# Patient Record
Sex: Male | Born: 1995 | Race: White | Hispanic: No | Marital: Married | Smoking: Never smoker
Health system: Southern US, Community
[De-identification: ages and names within clinical notes are randomized; demographics above are authoritative.]

## PROBLEM LIST (undated history)

## (undated) DIAGNOSIS — K7581 Nonalcoholic steatohepatitis (NASH): Secondary | ICD-10-CM

## (undated) DIAGNOSIS — E039 Hypothyroidism, unspecified: Secondary | ICD-10-CM

## (undated) DIAGNOSIS — L259 Unspecified contact dermatitis, unspecified cause: Secondary | ICD-10-CM

## (undated) DIAGNOSIS — E559 Vitamin D deficiency, unspecified: Secondary | ICD-10-CM

## (undated) DIAGNOSIS — R748 Abnormal levels of other serum enzymes: Secondary | ICD-10-CM

## (undated) HISTORY — DX: Unspecified contact dermatitis, unspecified cause: L25.9

## (undated) HISTORY — DX: Hypothyroidism, unspecified: E03.9

## (undated) HISTORY — PX: GANGLION CYST EXCISION: SHX1691

## (undated) HISTORY — PX: CIRCUMCISION: SUR203

## (undated) HISTORY — DX: Vitamin D deficiency, unspecified: E55.9

## (undated) HISTORY — DX: Abnormal levels of other serum enzymes: R74.8

## (undated) HISTORY — DX: Nonalcoholic steatohepatitis (NASH): K75.81

---
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
## 2013-03-30 ENCOUNTER — Ambulatory Visit: Payer: Self-pay | Admitting: Physician Assistant

## 2014-10-24 ENCOUNTER — Ambulatory Visit: Payer: Self-pay | Admitting: Physician Assistant

## 2015-01-01 LAB — BASIC METABOLIC PANEL
BUN: 12 mg/dL (ref 4–21)
Creatinine: 1.2 mg/dL (ref <=1.3)
GLUCOSE: 90 mg/dL
Potassium: 4.4 mmol/L (ref 3.4–5.3)
Sodium: 141 mmol/L (ref 137–147)

## 2015-01-01 LAB — CBC AND DIFFERENTIAL
HEMATOCRIT: 44 % (ref 41–53)
HEMOGLOBIN: 15.2 g/dL (ref 13.5–17.5)
Neutrophils Absolute: 6 /uL
PLATELETS: 301 10*3/uL (ref 150–399)
WBC: 8.9 10*3/mL

## 2015-01-01 LAB — TSH: TSH: 2.8 u[IU]/mL (ref <=5.90)

## 2015-01-08 LAB — HEPATIC FUNCTION PANEL
ALK PHOS: 94 U/L (ref 25–125)
ALT: 100 U/L — AB (ref 10–40)
AST: 42 U/L — AB (ref 14–40)
BILIRUBIN, TOTAL: 0.5 mg/dL
Bilirubin, Direct: 0.14 mg/dL (ref 0.01–0.4)

## 2015-01-15 ENCOUNTER — Ambulatory Visit: Payer: Self-pay | Admitting: Family Medicine

## 2015-04-29 ENCOUNTER — Ambulatory Visit (INDEPENDENT_AMBULATORY_CARE_PROVIDER_SITE_OTHER): Payer: 59 | Admitting: Family Medicine

## 2015-04-29 ENCOUNTER — Encounter: Payer: Self-pay | Admitting: Family Medicine

## 2015-04-29 VITALS — BP 118/68 | HR 82 | Temp 98.2°F | Resp 16 | Ht 72.0 in | Wt 194.3 lb

## 2015-04-29 DIAGNOSIS — E038 Other specified hypothyroidism: Secondary | ICD-10-CM

## 2015-04-29 DIAGNOSIS — E559 Vitamin D deficiency, unspecified: Secondary | ICD-10-CM

## 2015-04-29 DIAGNOSIS — K7581 Nonalcoholic steatohepatitis (NASH): Secondary | ICD-10-CM | POA: Diagnosis not present

## 2015-04-29 DIAGNOSIS — L237 Allergic contact dermatitis due to plants, except food: Secondary | ICD-10-CM

## 2015-04-29 DIAGNOSIS — E063 Autoimmune thyroiditis: Secondary | ICD-10-CM

## 2015-04-29 MED ORDER — PREDNISONE 5 MG (48) PO TBPK: 5.0000 mg | ORAL_TABLET | Freq: Every day | ORAL | Status: DC

## 2015-04-29 NOTE — Progress Notes (Signed)
Name: Carlos Allen   MRN: [id number]    DOB: 19-Jul-1996   Date:04/29/2015       Progress Note  Subjective  Chief Complaint  Chief Complaint  Patient presents with  . Poison Ivy    Patient states that area is extremely irritated. He states that he got in to it on Sunday and developed rash on right arm yesterday.     HPI  NASH: he had elevated liver enzymes twice in 2016, US showed fatty liver, we will check other labs, he has not changed his diet, but has been exercise twice a week.  He denies nausea, vomiting, abdominal pain or steatorrhea.  Hypothyroidism: he has not seen Dr. Dario Guardian in over 9 months ago, he was taking Synthroid daily but stopped a few months ago because he was forgetting to take it and felt better without the medication, no weight gain since he stopped medication. Denies constipation, or dysphagia.   Rash: he was in the woods 5 days ago and four days ago the rash showed up and is getting much worse. Very pruriginous, but only mild spreading. Taking Benadryl and using topical calamine lotion.   Patient Active Problem List   Diagnosis Date Noted  . NASH (nonalcoholic steatohepatitis) 04/29/2015  . Vitamin D deficiency 04/29/2015  . Hypothyroidism, acquired, autoimmune 01/12/2010    Past Surgical History  Procedure Laterality Date  . Ganglion cyst excision    . Circumcision      Family History  Problem Relation Age of Onset  . Hypothyroidism Mother   . Amblyopia Brother     History   Social History  . Marital Status: Single    Spouse Name: N/A  . Number of Children: N/A  . Years of Education: N/A   Occupational History  . Student and [employer]    Social History Main Topics  . Smoking status: Never Smoker   . Smokeless tobacco: Not on file  . Alcohol Use: 0.0 oz/week    0 Standard drinks or equivalent per week     Comment: occasionally  . Drug Use: No  . Sexual Activity: Yes   Other Topics Concern  . Not  on file   Social History Narrative    No current outpatient prescriptions on file.  No Known Allergies   ROS  Constitutional: Negative for fever or weight change.  Respiratory: Negative for cough and shortness of breath.   Cardiovascular: Negative for chest pain or palpitations.  Gastrointestinal: Negative for abdominal pain, no bowel changes.  Musculoskeletal: Negative for gait problem or joint swelling.  Skin: positive  for rash.  Neurological: Negative for dizziness or headache.  No other specific complaints in a complete review of systems (except as listed in HPI above).  Objective  Filed Vitals:   04/29/15 1426  BP: 118/68  Pulse: 82  Temp: 98.2 F (36.8 C)  Resp: 16  Height: 6' (1.829 m)  Weight: 194 lb 4.8 oz (88.134 kg)  SpO2: 97%    Body mass index is 26.35 kg/(m^2).  Physical Exam  Constitutional: Patient appears well-developed and well-nourished. No distress.  Eyes:  No scleral icterus. PERL Neck: Normal range of motion. Neck supple. Negative for thyromegaly Skin: erythematous rash on right inner upper arm, some linear areas , no vesicles Cardiovascular: Normal rate, regular rhythm and normal heart sounds.  No murmur heard. No BLE edema. Pulmonary/Chest: Effort normal and breath sounds normal. No respiratory distress. Abdominal: Soft.  There is no tenderness. No hepatomegaly  Psychiatric: Patient has a normal mood and affect. behavior is normal. Judgment and thought content normal.  PHQ2/9: Depression screen PHQ 2/9 04/29/2015  Decreased Interest 0  Down, Depressed, Hopeless 0  PHQ - 2 Score 0     Fall Risk: Fall Risk  04/29/2015  Falls in the past year? No     Assessment & Plan  1. NASH (nonalcoholic steatohepatitis) Check labs, discussed importance of dietary modification and weight loss - Ferritin - Iron Binding Cap (TIBC) - CBC with Differential - Hepatitis, Acute  2. Vitamin D deficiency  - Vitamin D (25 hydroxy)  3.  Hypothyroidism, acquired, autoimmune Off medication, recheck labs and send to Dr. Dario GuardianJadali - TSH  4. Contact dermatitis due to poison ivy  - predniSONE (STERAPRED UNI-PAK 48 TAB) 5 MG (48) TBPK tablet; Take 1 tablet (5 mg total) by mouth daily.  Dispense: 48 tablet; Refill: 0

## 2015-04-30 ENCOUNTER — Other Ambulatory Visit: Payer: Self-pay | Admitting: Family Medicine

## 2015-04-30 ENCOUNTER — Telehealth: Payer: Self-pay

## 2015-04-30 DIAGNOSIS — R7989 Other specified abnormal findings of blood chemistry: Secondary | ICD-10-CM

## 2015-04-30 DIAGNOSIS — R945 Abnormal results of liver function studies: Principal | ICD-10-CM

## 2015-04-30 LAB — CBC WITH DIFFERENTIAL/PLATELET
Basophils Absolute: 0 10*3/uL (ref 0.0–0.2)
Basos: 1 %
EOS (ABSOLUTE): 0.1 10*3/uL (ref 0.0–0.4)
Eos: 1 %
HEMATOCRIT: 46.1 % (ref 37.5–51.0)
Hemoglobin: 15.8 g/dL (ref 12.6–17.7)
IMMATURE GRANULOCYTES: 0 %
Immature Grans (Abs): 0 10*3/uL (ref 0.0–0.1)
Lymphocytes Absolute: 1.5 10*3/uL (ref 0.7–3.1)
Lymphs: 18 %
MCH: 30.2 pg (ref 26.6–33.0)
MCHC: 34.3 g/dL (ref 31.5–35.7)
MCV: 88 fL (ref 79–97)
Monocytes Absolute: 0.7 10*3/uL (ref 0.1–0.9)
Monocytes: 9 %
NEUTROS ABS: 5.7 10*3/uL (ref 1.4–7.0)
Neutrophils: 71 %
Platelets: 283 10*3/uL (ref 150–379)
RBC: 5.24 x10E6/uL (ref 4.14–5.80)
RDW: 13.2 % (ref 12.3–15.4)
WBC: 8 10*3/uL (ref 3.4–10.8)

## 2015-04-30 LAB — VITAMIN D 25 HYDROXY (VIT D DEFICIENCY, FRACTURES): Vit D, 25-Hydroxy: 20.8 ng/mL — ABNORMAL LOW (ref 30.0–100.0)

## 2015-04-30 LAB — IRON AND TIBC
IRON SATURATION: 37 % (ref 15–55)
Iron: 115 ug/dL (ref 38–169)
TIBC: 308 ug/dL (ref 250–450)
UIBC: 193 ug/dL (ref 111–343)

## 2015-04-30 LAB — FERRITIN: Ferritin: 95 ng/mL (ref 30–400)

## 2015-04-30 LAB — HEPATITIS PANEL, ACUTE
HEP B S AG: NEGATIVE
Hep A IgM: NEGATIVE
Hep B C IgM: NEGATIVE

## 2015-04-30 LAB — TSH: TSH: 1.28 u[IU]/mL (ref 0.450–4.500)

## 2015-04-30 NOTE — Progress Notes (Signed)
Patient notified

## 2015-04-30 NOTE — Telephone Encounter (Signed)
Left v/m to call back for lab results

## 2015-04-30 NOTE — Telephone Encounter (Signed)
-----   Message from Alba CoryKrichna Sowles, MD sent at 04/30/2015  8:15 AM EDT ----- Labs for evaluation of liver function were all within normal limits, but I forgot to order a liver function test and we will add that Vitamin D slightly low, can take otc vitamin D 2000units daily  TSH is back to normal.  He may have had thyroiditis that caused hypothyroidism, but is back to normal now, he has been off levothyroxine and can stay off

## 2015-04-30 NOTE — Progress Notes (Signed)
Called [facility] and added test.

## 2015-05-01 LAB — HEPATIC FUNCTION PANEL
ALK PHOS: 112 IU/L (ref 39–117)
ALT: 52 IU/L — ABNORMAL HIGH (ref 0–44)
AST: 21 IU/L (ref 0–40)
Albumin: 5 g/dL (ref 3.5–5.5)
BILIRUBIN TOTAL: 0.3 mg/dL (ref 0.0–1.2)
BILIRUBIN, DIRECT: 0.07 mg/dL (ref 0.00–0.40)
Total Protein: 7.2 g/dL (ref 6.0–8.5)

## 2015-05-01 LAB — SPECIMEN STATUS REPORT

## 2015-05-18 ENCOUNTER — Ambulatory Visit (INDEPENDENT_AMBULATORY_CARE_PROVIDER_SITE_OTHER): Payer: 59 | Admitting: Family Medicine

## 2015-05-18 ENCOUNTER — Encounter: Payer: Self-pay | Admitting: Family Medicine

## 2015-05-18 VITALS — BP 118/72 | HR 91 | Temp 99.1°F | Resp 18 | Ht 73.0 in | Wt 183.3 lb

## 2015-05-18 DIAGNOSIS — K2900 Acute gastritis without bleeding: Secondary | ICD-10-CM | POA: Insufficient documentation

## 2015-05-18 DIAGNOSIS — L237 Allergic contact dermatitis due to plants, except food: Secondary | ICD-10-CM | POA: Insufficient documentation

## 2015-05-18 DIAGNOSIS — Z872 Personal history of diseases of the skin and subcutaneous tissue: Secondary | ICD-10-CM | POA: Insufficient documentation

## 2015-05-18 MED ORDER — PREDNISONE 5 MG (48) PO TBPK: 5.0000 mg | ORAL_TABLET | Freq: Every day | ORAL | Status: DC

## 2015-05-18 MED ORDER — ONDANSETRON 8 MG PO TBDP: 8.0000 mg | ORAL_TABLET | Freq: Three times a day (TID) | ORAL | Status: DC | PRN

## 2015-05-18 MED ORDER — OMEPRAZOLE 20 MG PO CPDR: 20.0000 mg | DELAYED_RELEASE_CAPSULE | Freq: Two times a day (BID) | ORAL | Status: DC

## 2015-05-18 NOTE — Progress Notes (Signed)
Name: Carlos Allen   MRN: [id number]    DOB: Feb 15, 1996   Date:05/18/2015       Progress Note  Subjective  Chief Complaint  Chief Complaint  Patient presents with  . Nausea    patient reports of nausea and vomitting since Saturday. Patient has been dry heaving & somewhat loose stool. Patient has eaten a bland diet and was able to keep down some chicken noodle soup last night.    HPI  Abdominal Pain: Patient complains of abdominal pain. The pain is described as cramping and dull, and is 3/10 in intensity. Pain is located in the epigastric without radiation. Onset was 3 days ago. Symptoms have been gradually improving since. Aggravating factors: dairy products, fatty foods and spicy foods.  Alleviating factors: bowel movements, liquids, sitting up and bland liquid based diet. Associated symptoms: diarrhea, nausea, vomiting and although vomiting has resolved and the diarrhea is improving. The patient denies arthralgias, chills, dysuria, fever, headache, hematuria, melena and myalgias. Denies recent travel or exposure to new foods or restaurants. Does work at a [employer] facility but does not feel he has had any unusual exposure.   Rash: Patient complains of rash involving the arms. Rash started a few weeks ago. Appearance of rash at onset: Color of lesion(s): brown and pink, Texture of lesion(s): raised. Rash has not changed over time Initial distribution: bilateral arm and exposed areas where he was working in the yard.  Discomfort associated with rash: is pruritic.  Associated symptoms: none. Denies: arthralgia, cough, decrease in energy level, fever and headache. Patient has had previous evaluation of rash. Patient has had previous treatment.  Response to treatment: Prednisone taper pack and topical corticosteroid cream. Requesting refill today due to repeat exposure to poison ivy. Patient has not had contacts with similar rash. Patient has identified precipitant. Patient has had  new exposures to same plant based irritant.     Patient Active Problem List   Diagnosis Date Noted  . NASH (nonalcoholic steatohepatitis) 04/29/2015  . Vitamin D deficiency 04/29/2015  . Hypothyroidism, acquired, autoimmune 01/12/2010    History  Substance Use Topics  . Smoking status: Never Smoker   . Smokeless tobacco: Not on file  . Alcohol Use: No     Comment: occasionally    No current outpatient prescriptions on file.  Past Surgical History  Procedure Laterality Date  . Ganglion cyst excision    . Circumcision      Family History  Problem Relation Age of Onset  . Hypothyroidism Mother   . Amblyopia Brother     No Known Allergies   Review of Systems  CONSTITUTIONAL: No significant weight changes, fever, chills, weakness or fatigue.  HEENT:  - Eyes: No visual changes.  - Ears: No auditory changes. No pain.  - Nose: No sneezing, congestion, runny nose. - Throat: No sore throat. No changes in swallowing. SKIN: Yes rash and itching.  CARDIOVASCULAR: No chest pain, chest pressure or chest discomfort. No palpitations or edema.  RESPIRATORY: No shortness of breath, cough or sputum.  GASTROINTESTINAL: Yes anorexia, nausea, vomiting. Yes changes in bowel habits. Yes abdominal pain.  GENITOURINARY: No dysuria. No frequency. No discharge. NEUROLOGICAL: No headache, dizziness, syncope, paralysis, ataxia, numbness or tingling in the extremities. No memory changes. No change in bowel or bladder control.  MUSCULOSKELETAL: No joint pain. No muscle pain. HEMATOLOGIC: No anemia, bleeding or bruising.  LYMPHATICS: No enlarged lymph nodes.  PSYCHIATRIC: No change in mood. No change in sleep pattern.  ENDOCRINOLOGIC:  No reports of sweating, cold or heat intolerance. No polyuria or polydipsia.      Objective  BP 118/72 mmHg  Pulse 91  Temp(Src) 99.1 F (37.3 C) (Oral)  Resp 18  Ht 6\' 1"  (1.854 m)  Wt 183 lb 4.8 oz (83.144 kg)  BMI 24.19 kg/m2  SpO2 97% Body mass  index is 24.19 kg/(m^2).  Physical Exam   Constitutional: Patient appears well-developed and well-nourished. In no distress.  HEENT:  - Head: Normocephalic and atraumatic.  - Ears: Bilateral TMs gray, no erythema or effusion - Nose: Nasal mucosa moist - Mouth/Throat: Oropharynx is clear and moist. No tonsillar hypertrophy or erythema. No post nasal drainage.  - Eyes: Conjunctivae clear, EOM movements normal. PERRLA. No scleral icterus.  Neck: Normal range of motion. Neck supple. No JVD present. No thyromegaly present.  Cardiovascular: Normal rate, regular rhythm and normal heart sounds.  No murmur heard.  Pulmonary/Chest: Effort normal and breath sounds normal. No respiratory distress. Abdomen: Soft, non distended, mild tenderness at epigastric area, no rebound, no guarding, no hepatosplenomegaly. Musculoskeletal: Normal range of motion bilateral UE and LE, no joint effusions. Peripheral vascular: Bilateral LE no edema. Neurological: CN II-XII grossly intact with no focal deficits. Alert and oriented to person, place, and time. Coordination, balance, strength, speech and gait are normal.  Skin: Skin is warm and dry. Mildly erythematous rash on right inner upper arm and left  arm, some linear areas, no vesicles.  Psychiatric: Patient has a normal mood and affect. Behavior is normal in office today. Judgment and thought content normal in office today.   Recent Results (from the past 2160 hour(s))  Ferritin     Status: None   Collection Time: 04/29/15  3:10 PM  Result Value Ref Range   Ferritin 95 30 - 400 ng/mL  Iron Binding Cap (TIBC)     Status: None   Collection Time: 04/29/15  3:10 PM  Result Value Ref Range   Total Iron Binding Capacity 308 250 - 450 ug/dL   UIBC 161 096 - 045 ug/dL   Iron 409 38 - 811 ug/dL   Iron Saturation 37 15 - 55 %  CBC with Differential     Status: None   Collection Time: 04/29/15  3:10 PM  Result Value Ref Range   WBC 8.0 3.4 - 10.8 x10E3/uL   RBC  5.24 4.14 - 5.80 x10E6/uL   Hemoglobin 15.8 12.6 - 17.7 g/dL   Hematocrit 91.4 78.2 - 51.0 %   MCV 88 79 - 97 fL   MCH 30.2 26.6 - 33.0 pg   MCHC 34.3 31.5 - 35.7 g/dL   RDW 95.6 21.3 - 08.6 %   Platelets 283 150 - 379 x10E3/uL   Neutrophils 71 %   Lymphs 18 %   Monocytes 9 %   Eos 1 %   Basos 1 %   Neutrophils Absolute 5.7 1.4 - 7.0 x10E3/uL   Lymphocytes Absolute 1.5 0.7 - 3.1 x10E3/uL   Monocytes Absolute 0.7 0.1 - 0.9 x10E3/uL   EOS (ABSOLUTE) 0.1 0.0 - 0.4 x10E3/uL   Basophils Absolute 0.0 0.0 - 0.2 x10E3/uL   Immature Granulocytes 0 %   Immature Grans (Abs) 0.0 0.0 - 0.1 x10E3/uL  Hepatitis, Acute     Status: None   Collection Time: 04/29/15  3:10 PM  Result Value Ref Range   Hep A IgM Negative Negative   Hepatitis B Surface Ag Negative Negative   Hep B C IgM Negative Negative   Hep  C Virus Ab <0.1 0.0 - 0.9 s/co ratio    Comment:                                   Negative:     < 0.8                              Indeterminate: 0.8 - 0.9                                   Positive:     > 0.9  The CDC recommends that a positive HCV antibody result  be followed up with a HCV Nucleic Acid Amplification  test (161096).   Vitamin D (25 hydroxy)     Status: Abnormal   Collection Time: 04/29/15  3:10 PM  Result Value Ref Range   Vit D, 25-Hydroxy 20.8 (L) 30.0 - 100.0 ng/mL    Comment: Vitamin D deficiency has been defined by the Institute of Medicine and an Endocrine Society practice guideline as a level of serum 25-OH vitamin D less than 20 ng/mL (1,2). The Endocrine Society went on to further define vitamin D insufficiency as a level between 21 and 29 ng/mL (2). 1. IOM (Institute of Medicine). 2010. Dietary reference    intakes for calcium and D. [place]: The    [employer]. 2. Holick MF, Binkley [place], Bischoff-Ferrari HA, et al.    Evaluation, treatment, and prevention of vitamin D    deficiency: an Endocrine Society clinical practice    guideline.  JCEM. 2011 Jul; 96(7):1911-30.   TSH     Status: None   Collection Time: 04/29/15  3:10 PM  Result Value Ref Range   TSH 1.280 0.450 - 4.500 uIU/mL  Hepatic function panel     Status: Abnormal   Collection Time: 04/29/15  3:10 PM  Result Value Ref Range   Total Protein 7.2 6.0 - 8.5 g/dL   Albumin 5.0 3.5 - 5.5 g/dL   Bilirubin Total 0.3 0.0 - 1.2 mg/dL   Bilirubin, Direct 0.45 0.00 - 0.40 mg/dL   Alkaline Phosphatase 112 39 - 117 IU/L   AST 21 0 - 40 IU/L   ALT 52 (H) 0 - 44 IU/L  Specimen status report     Status: None   Collection Time: 04/29/15  3:10 PM  Result Value Ref Range   specimen status report Comment     Comment: Written Authorization Written Authorization Written Authorization Received. Authorization received from [facility] SOWLES 05-01-2015 Logged by Levin Erp      Assessment & Plan  1. Acute gastritis without bleeding Recommended continued modified diet advance as tolerated, continue working on hydration, vitals stable. Start PPI and prn use of Zofran. If symptoms continue will get additional lab work and/or stool studies. Unlikely related to his NASH at this time.  - omeprazole (PRILOSEC) 20 MG capsule; Take 1 capsule (20 mg total) by mouth 2 (two) times daily before a meal.  Dispense: 28 capsule; Refill: 0 - ondansetron (ZOFRAN-ODT) 8 MG disintegrating tablet; Take 1 tablet (8 mg total) by mouth every 8 (eight) hours as needed for nausea or vomiting.  Dispense: 30 tablet; Refill: 1  2. Contact dermatitis due to poison ivy Milder form of previous exposure, refilled prednisone per patient request.   -  predniSONE (STERAPRED UNI-PAK 48 TAB) 5 MG (48) TBPK tablet; Take 1 tablet (5 mg total) by mouth daily. Use as directed in a 12 day taper PredPak  Dispense: 48 tablet; Refill: 0

## 2015-05-18 NOTE — Patient Instructions (Signed)

## 2015-07-01 ENCOUNTER — Ambulatory Visit (INDEPENDENT_AMBULATORY_CARE_PROVIDER_SITE_OTHER): Payer: 59 | Admitting: Family Medicine

## 2015-07-01 ENCOUNTER — Encounter: Payer: Self-pay | Admitting: Family Medicine

## 2015-07-01 VITALS — BP 122/86 | HR 95 | Temp 98.3°F | Resp 18 | Ht 72.0 in | Wt 183.3 lb

## 2015-07-01 DIAGNOSIS — K7581 Nonalcoholic steatohepatitis (NASH): Secondary | ICD-10-CM

## 2015-07-01 DIAGNOSIS — Z113 Encounter for screening for infections with a predominantly sexual mode of transmission: Secondary | ICD-10-CM

## 2015-07-01 DIAGNOSIS — F410 Panic disorder [episodic paroxysmal anxiety] without agoraphobia: Secondary | ICD-10-CM | POA: Diagnosis not present

## 2015-07-01 DIAGNOSIS — Z23 Encounter for immunization: Secondary | ICD-10-CM | POA: Diagnosis not present

## 2015-07-01 DIAGNOSIS — R300 Dysuria: Secondary | ICD-10-CM

## 2015-07-01 DIAGNOSIS — Z114 Encounter for screening for human immunodeficiency virus [HIV]: Secondary | ICD-10-CM | POA: Diagnosis not present

## 2015-07-01 LAB — POCT URINALYSIS DIPSTICK
BILIRUBIN UA: NEGATIVE
GLUCOSE UA: NEGATIVE
Ketones, UA: NEGATIVE
NITRITE UA: NEGATIVE
Protein, UA: NEGATIVE
RBC UA: NEGATIVE
Spec Grav, UA: 1.02
Urobilinogen, UA: 1
pH, UA: 6

## 2015-07-01 MED ORDER — CITALOPRAM HYDROBROMIDE 10 MG PO TABS: 10.0000 mg | ORAL_TABLET | Freq: Every day | ORAL | Status: DC

## 2015-07-01 MED ORDER — ALPRAZOLAM 0.5 MG PO TABS
0.5000 mg | ORAL_TABLET | Freq: Every evening | ORAL | Status: DC | PRN
Start: 2015-07-01 — End: 2016-05-05

## 2015-07-01 NOTE — Progress Notes (Signed)
Name: Carlos Allen   MRN: [id number]    DOB: 12-30-1995   Date:07/01/2015       Progress Note  Subjective  Chief Complaint  Chief Complaint  Patient presents with  . Anxiety    Patient has been trying to ignore it in the past, but last week worsen with vomiting and nausea. Then patient pasted out from being so exhausted and started vomiting again once he woke up.   . Dysuria    Onset-couple of days. Cloudy urine, and having burning while urinating.    HPI  Dysuria: he states over the past couple of days he has noticed cloudy urine, and has dysuria when he voids. He has some hesitancy. He had two new sexual partners in the past 6 weeks. Last one a couple of weeks ago.   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 He states he worries about his future. Having panic attacks about once a week, and after an episode has GI symptoms for about 2 days. Described as nausea and vomiting but no diarrhea.   NASH: last labs had improved, he drinks whiskey and coke - usually 3 doses every two weeks.   Patient Active Problem List   Diagnosis Date Noted  . Panic attack 07/01/2015  . History of allergic contact dermatitis 05/18/2015  . NASH (nonalcoholic steatohepatitis) 04/29/2015  . Vitamin D deficiency 04/29/2015  . Hypothyroidism, acquired, autoimmune 01/12/2010    Past Surgical History  Procedure Laterality Date  . Ganglion cyst excision    . Circumcision      Family History  Problem Relation Age of Onset  . Hypothyroidism Mother   . Amblyopia Brother     Social History   Social History  . Marital Status: Single    Spouse Name: N/A  . Number of Children: 0  . Years of Education: N/A   Occupational History  . Student and [employer]    Social History Main Topics  . Smoking status: Never Smoker    . Smokeless tobacco: Never Used  . Alcohol Use: 0.6 oz/week    1 Standard drinks or equivalent per week     Comment: occasionally  . Drug Use: No  . Sexual Activity:    Partners: Female    Copy: None   Other Topics Concern  . Not on file   Social History Narrative     Current outpatient prescriptions:  .  ALPRAZolam (XANAX) 0.5 MG tablet, Take 1 tablet (0.5 mg total) by mouth at bedtime as needed for anxiety., Disp: 30 tablet, Rfl: 0 .  citalopram (CELEXA) 10 MG tablet, Take 1 tablet (10 mg total) by mouth daily., Disp: 30 tablet, Rfl: 0  No Known Allergies   ROS  Constitutional: Negative for fever or weight change.  Respiratory: Negative for cough and shortness of breath.   Cardiovascular: Negative for chest pain or palpitations.  Gastrointestinal: Negative for abdominal pain, no bowel changes.  Musculoskeletal: Negative for gait problem or joint swelling.  Skin: Negative for rash.  Neurological: Negative for dizziness or headache.  No other specific complaints in a complete review of systems (except as listed in HPI above).   Objective  Filed Vitals:   07/01/15 1518  BP: 122/86  Pulse: 95  Temp: 98.3 F (36.8 C)  TempSrc: Oral  Resp: 18  Height: 6' (1.829 m)  Weight: 183 lb 4.8 oz (83.144 kg)  SpO2: 97%    Body mass index is 24.85 kg/(m^2).  Physical Exam  Constitutional: Patient appears well-developed and well-nourished. No distress.  HEENT: head atraumatic, normocephalic, pupils equal and reactive to light,neck supple, throat within normal limits Cardiovascular: Normal rate, regular rhythm and normal heart sounds.  No murmur heard. No BLE edema. Pulmonary/Chest: Effort normal and breath sounds normal. No respiratory distress. Abdominal: Soft.  There is no tenderness. Psychiatric: Patient has a normal mood and affect. behavior is normal. Judgment and thought content normal. ZO:XWRUE discharge in urethra, no rashes    Recent  Results (from the past 2160 hour(s))  Ferritin     Status: None   Collection Time: 04/29/15  3:10 PM  Result Value Ref Range   Ferritin 95 30 - 400 ng/mL  Iron Binding Cap (TIBC)     Status: None   Collection Time: 04/29/15  3:10 PM  Result Value Ref Range   Total Iron Binding Capacity 308 250 - 450 ug/dL   UIBC 454 098 - 119 ug/dL   Iron 147 38 - 829 ug/dL   Iron Saturation 37 15 - 55 %  CBC with Differential     Status: None   Collection Time: 04/29/15  3:10 PM  Result Value Ref Range   WBC 8.0 3.4 - 10.8 x10E3/uL   RBC 5.24 4.14 - 5.80 x10E6/uL   Hemoglobin 15.8 12.6 - 17.7 g/dL   Hematocrit 56.2 13.0 - 51.0 %   MCV 88 79 - 97 fL   MCH 30.2 26.6 - 33.0 pg   MCHC 34.3 31.5 - 35.7 g/dL   RDW 86.5 78.4 - 69.6 %   Platelets 283 150 - 379 x10E3/uL   Neutrophils 71 %   Lymphs 18 %   Monocytes 9 %   Eos 1 %   Basos 1 %   Neutrophils Absolute 5.7 1.4 - 7.0 x10E3/uL   Lymphocytes Absolute 1.5 0.7 - 3.1 x10E3/uL   Monocytes Absolute 0.7 0.1 - 0.9 x10E3/uL   EOS (ABSOLUTE) 0.1 0.0 - 0.4 x10E3/uL   Basophils Absolute 0.0 0.0 - 0.2 x10E3/uL   Immature Granulocytes 0 %   Immature Grans (Abs) 0.0 0.0 - 0.1 x10E3/uL  Hepatitis, Acute     Status: None   Collection Time: 04/29/15  3:10 PM  Result Value Ref Range   Hep A IgM Negative Negative   Hepatitis B Surface Ag Negative Negative   Hep B C IgM Negative Negative   Hep C Virus Ab <0.1 0.0 - 0.9 s/co ratio    Comment:                                   Negative:     < 0.8                              Indeterminate: 0.8 - 0.9                                   Positive:     > 0.9  The CDC recommends that a positive HCV antibody result  be followed up with a HCV Nucleic Acid Amplification  test (295284).   Vitamin D (25 hydroxy)  Status: Abnormal   Collection Time: 04/29/15  3:10 PM  Result Value Ref Range   Vit D, 25-Hydroxy 20.8 (L) 30.0 - 100.0 ng/mL    Comment: Vitamin D deficiency has been defined by the Institute  of Medicine and an Endocrine Society practice guideline as a level of serum 25-OH vitamin D less than 20 ng/mL (1,2). The Endocrine Society went on to further define vitamin D insufficiency as a level between 21 and 29 ng/mL (2). 1. IOM (Institute of Medicine). 2010. Dietary reference    intakes for calcium and D. [place]: The    [employer]. 2. Holick MF, Binkley [place], Bischoff-Ferrari HA, et al.    Evaluation, treatment, and prevention of vitamin D    deficiency: an Endocrine Society clinical practice    guideline. JCEM. 2011 Jul; 96(7):1911-30.   TSH     Status: None   Collection Time: 04/29/15  3:10 PM  Result Value Ref Range   TSH 1.280 0.450 - 4.500 uIU/mL  Hepatic function panel     Status: Abnormal   Collection Time: 04/29/15  3:10 PM  Result Value Ref Range   Total Protein 7.2 6.0 - 8.5 g/dL   Albumin 5.0 3.5 - 5.5 g/dL   Bilirubin Total 0.3 0.0 - 1.2 mg/dL   Bilirubin, Direct 4.09 0.00 - 0.40 mg/dL   Alkaline Phosphatase 112 39 - 117 IU/L   AST 21 0 - 40 IU/L   ALT 52 (H) 0 - 44 IU/L  Specimen status report     Status: None   Collection Time: 04/29/15  3:10 PM  Result Value Ref Range   specimen status report Comment     Comment: Written Authorization Written Authorization Written Authorization Received. Authorization received from [facility] Caydyn Sprung 05-01-2015 Logged by Levin Erp      PHQ2/9: Depression screen [facility] 2/9 04/29/2015  Decreased Interest 0  Down, Depressed, Hopeless 0  PHQ - 2 Score 0    Fall Risk: Fall Risk  04/29/2015  Falls in the past year? No    Assessment & Plan  1. Dysuria Check labs, rule out STI - POCT Urinalysis Dipstick - Urine culture - GC/chlamydia probe amp, urine  2. Panic attack We will try daily medication, discussed possible side effects of medication, like suicidal thoughts and ideation, weight gain, and lack of orgasm - citalopram (CELEXA) 10 MG tablet; Take 1 tablet (10 mg total) by mouth daily.   Dispense: 30 tablet; Refill: 0 - ALPRAZolam (XANAX) 0.5 MG tablet; Take 1 tablet (0.5 mg total) by mouth at bedtime as needed for anxiety.  Dispense: 30 tablet; Refill: 0  3. Screening for HIV (human immunodeficiency virus)  - HIV antibody  4. Routine screening for STI (sexually transmitted infection) -GC  5. NASH (nonalcoholic steatohepatitis)  - Comprehensive metabolic panel  6. Needs flu shot  - Flu Vaccine QUAD 36+ mos IM

## 2015-07-02 LAB — COMPREHENSIVE METABOLIC PANEL
ALT: 55 IU/L — ABNORMAL HIGH (ref 0–44)
AST: 25 IU/L (ref 0–40)
Albumin/Globulin Ratio: 2.2 (ref 1.1–2.5)
Albumin: 5 g/dL (ref 3.5–5.5)
Alkaline Phosphatase: 102 IU/L (ref 39–117)
BUN/Creatinine Ratio: 9 (ref 8–19)
BUN: 11 mg/dL (ref 6–20)
Bilirubin Total: 0.5 mg/dL (ref 0.0–1.2)
CALCIUM: 10.1 mg/dL (ref 8.7–10.2)
CO2: 26 mmol/L (ref 18–29)
CREATININE: 1.23 mg/dL (ref 0.76–1.27)
Chloride: 99 mmol/L (ref 97–108)
GFR calc non Af Amer: 83 mL/min/{1.73_m2} (ref >59)
GFR, EST AFRICAN AMERICAN: 96 mL/min/{1.73_m2} (ref >59)
Globulin, Total: 2.3 g/dL (ref 1.5–4.5)
Glucose: 103 mg/dL — ABNORMAL HIGH (ref 65–99)
Potassium: 4.2 mmol/L (ref 3.5–5.2)
SODIUM: 140 mmol/L (ref 134–144)
Total Protein: 7.3 g/dL (ref 6.0–8.5)

## 2015-07-02 LAB — HIV ANTIBODY (ROUTINE TESTING W REFLEX): HIV Screen 4th Generation wRfx: NONREACTIVE

## 2015-07-02 NOTE — Progress Notes (Signed)
Patient notified of blood work.  

## 2015-07-03 LAB — URINE CULTURE: ORGANISM ID, BACTERIA: NO GROWTH

## 2015-07-06 NOTE — Progress Notes (Signed)
Left voice mail

## 2015-08-02 ENCOUNTER — Ambulatory Visit: Payer: 59 | Admitting: Family Medicine

## 2015-08-05 ENCOUNTER — Encounter: Payer: Self-pay | Admitting: Family Medicine

## 2015-08-05 ENCOUNTER — Ambulatory Visit (INDEPENDENT_AMBULATORY_CARE_PROVIDER_SITE_OTHER): Payer: 59 | Admitting: Family Medicine

## 2015-08-05 VITALS — BP 112/68 | HR 75 | Temp 98.8°F | Resp 16 | Ht 72.0 in | Wt 177.5 lb

## 2015-08-05 DIAGNOSIS — K7581 Nonalcoholic steatohepatitis (NASH): Secondary | ICD-10-CM | POA: Diagnosis not present

## 2015-08-05 DIAGNOSIS — E063 Autoimmune thyroiditis: Secondary | ICD-10-CM

## 2015-08-05 DIAGNOSIS — Z8619 Personal history of other infectious and parasitic diseases: Secondary | ICD-10-CM | POA: Diagnosis not present

## 2015-08-05 DIAGNOSIS — E038 Other specified hypothyroidism: Secondary | ICD-10-CM | POA: Diagnosis not present

## 2015-08-05 DIAGNOSIS — F410 Panic disorder [episodic paroxysmal anxiety] without agoraphobia: Secondary | ICD-10-CM | POA: Diagnosis not present

## 2015-08-05 MED ORDER — CITALOPRAM HYDROBROMIDE 20 MG PO TABS: 20.0000 mg | ORAL_TABLET | Freq: Every day | ORAL | Status: DC

## 2015-08-05 NOTE — Progress Notes (Signed)
Name: Carlos Allen   MRN: [id number]    DOB: 11-14-1995   Date:08/05/2015       Progress Note  Subjective  Chief Complaint  Chief Complaint  Patient presents with  . 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 Denies sadness. Feels anxious, nervous feeling at night , difficulty shutting off his brain. Wakes up at times with a full blown panic attack. Since started on medication no recent panic attacks : described as chest tightness, nausea, SOB, palpitation, hot feeling, sometimes vomiting, once had a feeling that he was going to die.   NASH: patient was supposed to have more testing done in March but never went to the lab, he is asymptomatic, we will check labs, but because of his age also refer to GI to see if there is any other testing that has to be done  Hypothyroidism: off medications, we will recheck levels .   Patient Active Problem List   Diagnosis Date Noted  . Panic attack 07/01/2015  . History of allergic contact dermatitis 05/18/2015  . NASH (nonalcoholic steatohepatitis) 04/29/2015  . Vitamin D deficiency 04/29/2015  . Hypothyroidism, acquired, autoimmune 01/12/2010    Past Surgical History  Procedure Laterality Date  . Ganglion cyst excision    . Circumcision      Family History  Problem Relation Age of Onset  . Hypothyroidism Mother   . Amblyopia Brother     Social History   Social History  . Marital Status: Single    Spouse Name: N/A  . Number of Children: 0  . Years of Education: N/A   Occupational History  . Student and [employer]    Social History Main Topics  . Smoking status: Never Smoker   . Smokeless tobacco: Never Used  . Alcohol Use: 0.6 oz/week    1 Standard drinks or equivalent per week     Comment: occasionally  . Drug Use:  No  . Sexual Activity:    Partners: Female    Copy: None   Other Topics Concern  . Not on file   Social History Narrative     Current outpatient prescriptions:  .  ALPRAZolam (XANAX) 0.5 MG tablet, Take 1 tablet (0.5 mg total) by mouth at bedtime as needed for anxiety., Disp: 30 tablet, Rfl: 0 .  citalopram (CELEXA) 20 MG tablet, Take 1 tablet (20 mg total) by mouth daily., Disp: 30 tablet, Rfl: 1  No Known Allergies   ROS  Constitutional: Negative for fever or weight change.  Respiratory: Negative for cough and shortness of breath.   Cardiovascular: Negative for chest pain or palpitations.  Gastrointestinal: Negative for abdominal pain, no bowel changes.  Musculoskeletal: Negative for gait problem or joint swelling.  Skin: Negative for rash.  Neurological: Negative for dizziness or headache.  No other specific complaints in a complete review of systems (except as listed in HPI above).  Objective  Filed Vitals:   08/05/15 1531  BP: 112/68  Pulse: 75  Temp: 98.8 F (37.1 C)  TempSrc: Oral  Resp: 16  Height: 6' (1.829 m)  Weight: 177 lb 8 oz (80.513 kg)  SpO2: 97%    Body mass index is 24.07 kg/(m^2).  Physical Exam   Constitutional: Patient appears well-developed and well-nourished. No distress.  HEENT: head atraumatic, normocephalic, pupils equal and reactive to light,  neck supple, throat within normal limits Cardiovascular: Normal rate, regular rhythm and normal heart sounds.  No murmur heard. No BLE edema. Pulmonary/Chest: Effort normal and breath sounds normal. No respiratory distress. Abdominal: Soft.  There is no tenderness.No hepatomegaly Psychiatric: Patient has a normal mood and affect. behavior is normal. Judgment and thought content normal.  Recent Results (from the past 2160 hour(s))  POCT Urinalysis Dipstick     Status: Abnormal   Collection Time: 07/01/15  3:55 PM  Result Value Ref Range   Color, UA dark yellow     Clarity, UA cloudy    Glucose, UA neg    Bilirubin, UA neg    Ketones, UA neg    Spec Grav, UA 1.020    Blood, UA neg    pH, UA 6.0    Protein, UA neg    Urobilinogen, UA 1.0    Nitrite, UA neg    Leukocytes, UA small (1+) (A) Negative  Comprehensive metabolic panel     Status: Abnormal   Collection Time: 07/01/15  4:08 PM  Result Value Ref Range   Glucose 103 (H) 65 - 99 mg/dL   BUN 11 6 - 20 mg/dL   Creatinine, Ser 1.61 0.76 - 1.27 mg/dL   GFR calc non Af Amer 83 >59 mL/min/1.73   GFR calc Af Amer 96 >59 mL/min/1.73   BUN/Creatinine Ratio 9 8 - 19   Sodium 140 134 - 144 mmol/L   Potassium 4.2 3.5 - 5.2 mmol/L   Chloride 99 97 - 108 mmol/L   CO2 26 18 - 29 mmol/L   Calcium 10.1 8.7 - 10.2 mg/dL   Total Protein 7.3 6.0 - 8.5 g/dL   Albumin 5.0 3.5 - 5.5 g/dL   Globulin, Total 2.3 1.5 - 4.5 g/dL   Albumin/Globulin Ratio 2.2 1.1 - 2.5   Bilirubin Total 0.5 0.0 - 1.2 mg/dL   Alkaline Phosphatase 102 39 - 117 IU/L   AST 25 0 - 40 IU/L   ALT 55 (H) 0 - 44 IU/L  HIV antibody     Status: None   Collection Time: 07/01/15  4:08 PM  Result Value Ref Range   HIV Screen 4th Generation wRfx Non Reactive Non Reactive  Urine culture     Status: None   Collection Time: 07/02/15 12:00 AM  Result Value Ref Range   Urine Culture, Routine Final report    Urine Culture result 1 No growth       PHQ2/9: Depression screen [facility] 2/9 08/05/2015 04/29/2015  Decreased Interest 0 0  Down, Depressed, Hopeless 0 0  PHQ - 2 Score 0 0    Fall Risk: Fall Risk  08/05/2015 04/29/2015  Falls in the past year? No No      Assessment & Plan  1. 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  Dispense: 30 tablet; Refill: 1  2. History of chlamydia  Treated at school, we will recheck it  next visit, took doxycycline for 7 days and finished last week, no dysuria  3. Panic attack   Less frequent since he started medication   4. NASH (nonalcoholic steatohepatitis)  -hepatic function panel  - Ferritin - Iron and TIBC - Hepatitis, Acute - Ceruloplasmin -  Alpha-1-Antitrypsin -refer to GI  5. Hypothyroidism, acquired, autoimmune  - TSH

## 2015-08-06 LAB — IRON AND TIBC
IRON: 137 ug/dL (ref 38–169)
Iron Saturation: 46 % (ref 15–55)
TIBC: 301 ug/dL (ref 250–450)
UIBC: 164 ug/dL (ref 111–343)

## 2015-08-06 LAB — HEPATITIS PANEL, ACUTE
HEP B C IGM: NEGATIVE
Hep A IgM: NEGATIVE
Hep C Virus Ab: <0.1 s/co ratio (ref 0.0–0.9)
Hepatitis B Surface Ag: NEGATIVE

## 2015-08-06 LAB — FERRITIN: FERRITIN: 228 ng/mL (ref 30–400)

## 2015-08-06 LAB — HEPATIC FUNCTION PANEL
ALT: 40 IU/L (ref 0–44)
AST: 19 IU/L (ref 0–40)
Albumin: 5 g/dL (ref 3.5–5.5)
Alkaline Phosphatase: 108 IU/L (ref 39–117)
Bilirubin Total: 0.4 mg/dL (ref 0.0–1.2)
Bilirubin, Direct: 0.13 mg/dL (ref 0.00–0.40)
Total Protein: 7.4 g/dL (ref 6.0–8.5)

## 2015-08-06 LAB — CERULOPLASMIN: CERULOPLASMIN: 25 mg/dL (ref 16.0–31.0)

## 2015-08-06 LAB — TSH: TSH: 1.78 u[IU]/mL (ref 0.450–4.500)

## 2015-08-06 LAB — ALPHA-1-ANTITRYPSIN: A1 ANTITRYPSIN: 131 mg/dL (ref 90–200)

## 2015-08-09 NOTE — Progress Notes (Signed)
Patient notified

## 2015-09-07 ENCOUNTER — Ambulatory Visit: Payer: 59 | Admitting: Family Medicine

## 2015-10-07 ENCOUNTER — Ambulatory Visit (INDEPENDENT_AMBULATORY_CARE_PROVIDER_SITE_OTHER): Payer: 59 | Admitting: Family Medicine

## 2015-10-07 ENCOUNTER — Other Ambulatory Visit: Payer: Self-pay | Admitting: Family Medicine

## 2015-10-07 ENCOUNTER — Encounter: Payer: Self-pay | Admitting: Family Medicine

## 2015-10-07 VITALS — BP 110/70 | HR 74 | Temp 98.0°F | Resp 16 | Ht 72.0 in | Wt 179.2 lb

## 2015-10-07 DIAGNOSIS — F410 Panic disorder [episodic paroxysmal anxiety] without agoraphobia: Secondary | ICD-10-CM | POA: Diagnosis not present

## 2015-10-07 DIAGNOSIS — K7581 Nonalcoholic steatohepatitis (NASH): Secondary | ICD-10-CM

## 2015-10-07 DIAGNOSIS — Z8639 Personal history of other endocrine, nutritional and metabolic disease: Secondary | ICD-10-CM | POA: Diagnosis not present

## 2015-10-07 DIAGNOSIS — M26622 Arthralgia of left temporomandibular joint: Secondary | ICD-10-CM | POA: Diagnosis not present

## 2015-10-07 DIAGNOSIS — Z8619 Personal history of other infectious and parasitic diseases: Secondary | ICD-10-CM | POA: Diagnosis not present

## 2015-10-07 MED ORDER — CITALOPRAM HYDROBROMIDE 20 MG PO TABS: 20.0000 mg | ORAL_TABLET | Freq: Every day | ORAL | Status: DC

## 2015-10-07 NOTE — Progress Notes (Signed)
Name: Carlos Allen   MRN: [id number]    DOB: 12-Jul-1996   Date:10/07/2015       Progress Note  Subjective  Chief Complaint  Chief Complaint  Patient presents with  . Medication Refill    Patient wabnts refill of xanax and celexa.  . Otalgia    Patient states left ear has been hurting for about a week and a half.    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 Denies sadness. The  Anxious and nervous feeling at night has resolved, also able to shut off his brain before bed. Wakes up at times with a full blown panic attack, but no episodes in the past couple of months : described as chest tightness, nausea, SOB, palpitation, hot feeling, sometimes vomiting, once had a feeling that he was going to die. He has not been using xanax lately, because celexa is working well.  NASH: all labs were within normal limits, he was referred GI but had to cancel appointment because of school exam, he will re-schedule it.   Hx of Hypothyroidism: off medications, TSH normal, we will monitor yearly  Otalgia: started about 10 days ago, constant aching sensation on left side. No cold symptoms preceding event and no nasal congestion. He has a history of TMJ and has a mouth piece at home, but has not been using it lately, advised to resume it. May take Tylenol prn   Chlamydia: treated and asymptomatic due for test of cure  Patient Active Problem List   Diagnosis Date Noted  . 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 History of allergic contact dermatitis 05/18/2015  . NASH (nonalcoholic steatohepatitis) 04/29/2015  . Vitamin D deficiency 04/29/2015  . History of hypothyroidism 01/12/2010    Past Surgical History  Procedure Laterality Date  . Ganglion cyst excision    . Circumcision      Family History  Problem Relation Age of Onset  . Hypothyroidism Mother   .  Amblyopia Brother     Social History   Social History  . Marital Status: Single    Spouse Name: N/A  . Number of Children: 0  . Years of Education: N/A   Occupational History  . Student and [employer]    Social History Main Topics  . Smoking status: Never Smoker   . Smokeless tobacco: Never Used  . Alcohol Use: 0.6 oz/week    1 Standard drinks or equivalent per week     Comment: occasionally  . Drug Use: No  . Sexual Activity:    Partners: Female    Copy: None   Other Topics Concern  . Not on file   Social History Narrative     Current outpatient prescriptions:  .  ALPRAZolam (XANAX) 0.5 MG tablet, Take 1 tablet (0.5 mg total) by mouth at bedtime as needed for anxiety., Disp: 30 tablet, Rfl: 0 .  citalopram (CELEXA) 20 MG tablet, Take 1 tablet (20 mg total) by mouth daily., Disp: 90 tablet, Rfl: 0  No Known Allergies   ROS   Constitutional: Negative for fever or weight change.  Respiratory: Negative for cough and shortness of breath.   Cardiovascular: Negative for chest pain or palpitations.  Gastrointestinal: Negative for abdominal pain, no bowel changes.  Musculoskeletal: Negative for gait problem or joint swelling.  Skin: Negative for rash.  Neurological: Negative for dizziness or headache.  No other  specific complaints in a complete review of systems (except as listed in HPI above).   Objective  Filed Vitals:   10/07/15 1152  BP: 110/70  Pulse: 74  Temp: 98 F (36.7 C)  TempSrc: Oral  Resp: 16  Height: 6' (1.829 m)  Weight: 179 lb 3.2 oz (81.285 kg)  SpO2: 96%    Body mass index is 24.3 kg/(m^2).  Physical Exam  Constitutional: Patient appears well-developed and well-nourished. No distress.  HEENT: head atraumatic, normocephalic, pupils equal and reactive to light, ears normal TM and ear canal, TMJ tender on left side, mild dislocation, neck supple, throat within normal limits Cardiovascular: Normal rate,  regular rhythm and normal heart sounds.  No murmur heard. No BLE edema. Pulmonary/Chest: Effort normal and breath sounds normal. No respiratory distress. Abdominal: Soft.  There is no tenderness. Psychiatric: Patient has a normal mood and affect. behavior is normal. Judgment and thought content normal.  Recent Results (from the past 2160 hour(s))  Hepatic function panel     Status: None   Collection Time: 08/05/15  4:25 PM  Result Value Ref Range   Total Protein 7.4 6.0 - 8.5 g/dL   Albumin 5.0 3.5 - 5.5 g/dL   Bilirubin Total 0.4 0.0 - 1.2 mg/dL   Bilirubin, Direct 4.090.13 0.00 - 0.40 mg/dL   Alkaline Phosphatase 108 39 - 117 IU/L   AST 19 0 - 40 IU/L   ALT 40 0 - 44 IU/L  Iron and TIBC     Status: None   Collection Time: 08/05/15  4:25 PM  Result Value Ref Range   Total Iron Binding Capacity 301 250 - 450 ug/dL   UIBC 811164 914111 - 782343 ug/dL   Iron 956137 38 - 213169 ug/dL   Iron Saturation 46 15 - 55 %  Hepatitis panel, acute     Status: None   Collection Time: 08/05/15  4:25 PM  Result Value Ref Range   Hep A IgM Negative Negative   Hepatitis B Surface Ag Negative Negative   Hep B C IgM Negative Negative   Hep C Virus Ab <0.1 0.0 - 0.9 s/co ratio    Comment:                                   Negative:     < 0.8                              Indeterminate: 0.8 - 0.9                                   Positive:     > 0.9  The CDC recommends that a positive HCV antibody result  be followed up with a HCV Nucleic Acid Amplification  test ([id number]).   TSH     Status: None   Collection Time: 08/05/15  4:25 PM  Result Value Ref Range   TSH 1.780 0.450 - 4.500 uIU/mL  Ceruloplasmin     Status: None   Collection Time: 08/05/15  4:25 PM  Result Value Ref Range   Ceruloplasmin 25.0 16.0 - 31.0 mg/dL  IONGE-9-[id number]-1-antitrypsin     Status: None   Collection Time: 08/05/15  4:25 PM  Result Value Ref Range   A-1 Antitrypsin 131 90 - 200 mg/dL  Ferritin  Status: None   Collection Time: 08/05/15   4:25 PM  Result Value Ref Range   Ferritin 228 30 - 400 ng/mL     PHQ2/9: Depression screen [facility] 2/9 10/07/2015 08/05/2015 04/29/2015  Decreased Interest 0 0 0  Down, Depressed, Hopeless 0 0 0  PHQ - 2 Score 0 0 0    Fall Risk: Fall Risk  10/07/2015 08/05/2015 04/29/2015  Falls in the past year? No No No     Functional Status Survey: Is the patient deaf or have difficulty hearing?: No Does the patient have difficulty seeing, even when wearing glasses/contacts?: No Does the patient have difficulty concentrating, remembering, or making decisions?: No Does the patient have difficulty walking or climbing stairs?: No Does the patient have difficulty dressing or bathing?: No Does the patient have difficulty doing errands alone such as visiting a doctor's office or shopping?: No   Assessment & Plan  1. NASH (nonalcoholic steatohepatitis)  Normal labs, needs follow up with GI  2. History of hypothyroidism  Normal TSH last visit  3. 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  Dispense: 90 tablet; Refill: 0  4. History of chlamydia  Recheck labs  - GC/chlamydia probe amp, urine  5. Arthralgia of left temporomandibular joint  Resume mouth guard and may take Tylenol prn

## 2015-10-12 LAB — GC/CHLAMYDIA PROBE AMP
CHLAMYDIA, DNA PROBE: NEGATIVE
NEISSERIA GONORRHOEAE BY PCR: NEGATIVE

## 2015-10-12 LAB — SPECIMEN STATUS REPORT

## 2015-11-16 ENCOUNTER — Other Ambulatory Visit: Payer: Self-pay | Admitting: Family Medicine

## 2015-11-16 DIAGNOSIS — F410 Panic disorder [episodic paroxysmal anxiety] without agoraphobia: Secondary | ICD-10-CM

## 2015-11-16 MED ORDER — CITALOPRAM HYDROBROMIDE 20 MG PO TABS: 20.0000 mg | ORAL_TABLET | Freq: Every day | ORAL | Status: DC

## 2015-11-16 NOTE — Telephone Encounter (Signed)
Refill for citalopram. His insurance will not cover a 30 day supply but will cover the 90 due to it being long term. Please send to [facility] [address] [place] [id number]. There number is [phone]

## 2016-05-05 ENCOUNTER — Ambulatory Visit (INDEPENDENT_AMBULATORY_CARE_PROVIDER_SITE_OTHER): Payer: 59 | Admitting: Family Medicine

## 2016-05-05 ENCOUNTER — Encounter: Payer: Self-pay | Admitting: Family Medicine

## 2016-05-05 VITALS — BP 118/86 | HR 100 | Temp 98.8°F | Resp 18 | Ht 72.0 in | Wt 196.2 lb

## 2016-05-05 DIAGNOSIS — R1013 Epigastric pain: Secondary | ICD-10-CM

## 2016-05-05 DIAGNOSIS — K7581 Nonalcoholic steatohepatitis (NASH): Secondary | ICD-10-CM | POA: Diagnosis not present

## 2016-05-05 DIAGNOSIS — Z8639 Personal history of other endocrine, nutritional and metabolic disease: Secondary | ICD-10-CM

## 2016-05-05 DIAGNOSIS — R109 Unspecified abdominal pain: Secondary | ICD-10-CM | POA: Diagnosis not present

## 2016-05-05 DIAGNOSIS — R112 Nausea with vomiting, unspecified: Secondary | ICD-10-CM

## 2016-05-05 DIAGNOSIS — E559 Vitamin D deficiency, unspecified: Secondary | ICD-10-CM

## 2016-05-05 MED ORDER — OMEPRAZOLE 40 MG PO CPDR: 40.0000 mg | DELAYED_RELEASE_CAPSULE | Freq: Every day | ORAL | Status: DC

## 2016-05-05 MED ORDER — PROMETHAZINE HCL 12.5 MG PO TABS: 12.5000 mg | ORAL_TABLET | Freq: Three times a day (TID) | ORAL | Status: DC | PRN

## 2016-05-05 NOTE — Progress Notes (Signed)
Name: Carlos Allen   MRN: [id number]    DOB: January 14, 1996   Date:05/05/2016       Progress Note  Subjective  Chief Complaint  Chief Complaint  Patient presents with  . GI Problem    patient stated that he has been having some intermittent abdominal issues that makes him vomit and results in him missing work. he stated that he was not able to see the GI specialist due to his schedule and otc meds are not helping. no changes with bowel movement.  . Referral    [place]    HPI  Nausea/vomting and abdominal pain: he states that a few weeks ago he had fever, vomiting and diarrhea that lasted 4 days. He states it was not associated with abdominal pain. He did not seek medical help. He took some Tums and something else that he is not sure of the name. He states he woke up at 2 am of July 5th vomiting. He states he feels nausea and is worse after meals and when he wakes up in am. Vomiting is usually only bile. He has noticed epigastric pain that radiates to both upper quadrants, described as burning and sometimes a gnawing pain. No change in bowel movements. He has one or two episodes of vomiting per day. No fever at this time, no rashes. No sick contacts. Denies drinking before symptoms started.    Patient Active Problem List   Diagnosis Date Noted  . 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 History of allergic contact dermatitis 05/18/2015  . NASH (nonalcoholic steatohepatitis) 04/29/2015  . Vitamin D deficiency 04/29/2015  . History of hypothyroidism 01/12/2010    Past Surgical History  Procedure Laterality Date  . Ganglion cyst excision    . Circumcision      Family History  Problem Relation Age of Onset  . Hypothyroidism Mother   . Amblyopia Brother     Social History   Social History  . Marital Status: Single    Spouse Name: N/A  . Number of Children: 0  . Years of Education: N/A    Occupational History  . Student and [employer]    Social History Main Topics  . Smoking status: Never Smoker   . Smokeless tobacco: Never Used  . Alcohol Use: 0.6 oz/week    1 Standard drinks or equivalent per week     Comment: occasionally  . Drug Use: No  . Sexual Activity:    Partners: Female    CopyBirth Control/ Protection: None   Other Topics Concern  . Not on file   Social History Narrative     Current outpatient prescriptions:  .  calcium carbonate (TUMS EX) 750 MG chewable tablet, Chew 1 tablet by mouth daily., Disp: , Rfl:  .  omeprazole (PRILOSEC) 40 MG capsule, Take 1 capsule (40 mg total) by mouth daily., Disp: 30 capsule, Rfl: 0 .  promethazine (PHENERGAN) 12.5 MG tablet, Take 1 tablet (12.5 mg total) by mouth every 8 (eight) hours as needed for nausea or vomiting., Disp: 20 tablet, Rfl: 0  No Known Allergies   ROS  Ten systems reviewed and is negative except as mentioned in HPI   Objective  Filed Vitals:   05/05/16 1555  BP: 118/86  Pulse: 100  Temp: 98.8 F (37.1 C)  TempSrc: Oral  Resp: 18  Height: 6' (1.829 m)  Weight: 196 lb 3.2 oz (88.996 kg)  SpO2: 98%  Body mass index is 26.6 kg/(m^2).  Physical Exam  Constitutional: Patient appears well-developed and well-nourished.  No distress.  HEENT: head atraumatic, normocephalic, pupils equal and reactive to light,  neck supple, throat within normal limits Cardiovascular: Normal rate, regular rhythm and normal heart sounds.  No murmur heard. No BLE edema. Pulmonary/Chest: Effort normal and breath sounds normal. No respiratory distress. Abdominal: Soft.  There is  Tenderness epigastric and LUQ pain. Psychiatric: Patient has a normal mood and affect. behavior is normal. Judgment and thought content normal.  PHQ2/9: [facility] 2/[facility] 05/05/2016 10/07/2015 08/05/2015 04/29/2015  Decreased Interest 0 0 0 0  Down, Depressed, Hopeless 0 0 0 0  PHQ - 2 Score 0 0 0 0    Fall  Risk: Fall Risk  05/05/2016 10/07/2015 08/05/2015 04/29/2015  Falls in the past year? No No No No    Functional Status Survey: Is the patient deaf or have difficulty hearing?: No Does the patient have difficulty seeing, even when wearing glasses/contacts?: No Does the patient have difficulty concentrating, remembering, or making decisions?: No Does the patient have difficulty walking or climbing stairs?: No Does the patient have difficulty dressing or bathing?: No Does the patient have difficulty doing errands alone such as visiting a doctor's office or shopping?: No    Assessment & Plan  1. History of hypothyroidism  - TSH  2. Vitamin D deficiency  - VITAMIN D 25 Hydroxy (Vit-D Deficiency, Fractures)  3. NASH (nonalcoholic steatohepatitis)  Previous evaluation was negative for secondary causes, never saw GI because of scheduling problems -refer to GI  4. Nausea and vomiting, vomiting of unspecified type  - promethazine (PHENERGAN) 12.5 MG tablet; Take 1 tablet (12.5 mg total) by mouth every 8 (eight) hours as needed for nausea or vomiting.  Dispense: 20 tablet; Refill: 0  5. Epigastric pain  - omeprazole (PRILOSEC) 40 MG capsule; Take 1 capsule (40 mg total) by mouth daily.  Dispense: 30 capsule; Refill: 0  6. Abdominal pain, unspecified abdominal location  - CBC with Differential/Platelet - Comprehensive metabolic panel - H. pylori antibody, IgG - US Abdomen Limited RUQ; Future - Lipase - omeprazole (PRILOSEC) 40 MG capsule; Take 1 capsule (40 mg total) by mouth daily.  Dispense: 30 capsule; Refill: 0

## 2016-05-06 LAB — CBC WITH DIFFERENTIAL/PLATELET
BASOS ABS: 95 {cells}/uL (ref 0–200)
Basophils Relative: 1 %
EOS PCT: 1 %
Eosinophils Absolute: 95 cells/uL (ref 15–500)
HCT: 47.4 % (ref 38.5–50.0)
HEMOGLOBIN: 16.4 g/dL (ref 13.2–17.1)
LYMPHS ABS: 1995 {cells}/uL (ref 850–3900)
Lymphocytes Relative: 21 %
MCH: 30.4 pg (ref 27.0–33.0)
MCHC: 34.6 g/dL (ref 32.0–36.0)
MCV: 87.8 fL (ref 80.0–100.0)
MONOS PCT: 10 %
MPV: 11.1 fL (ref 7.5–12.5)
Monocytes Absolute: 950 cells/uL (ref 200–950)
NEUTROS PCT: 67 %
Neutro Abs: 6365 cells/uL (ref 1500–7800)
PLATELETS: 299 10*3/uL (ref 140–400)
RBC: 5.4 MIL/uL (ref 4.20–5.80)
RDW: 13.3 % (ref 11.0–15.0)
WBC: 9.5 10*3/uL (ref 3.8–10.8)

## 2016-05-06 LAB — LIPASE: LIPASE: 16 U/L (ref 7–60)

## 2016-05-06 LAB — COMPREHENSIVE METABOLIC PANEL
ALK PHOS: 81 U/L (ref 40–115)
ALT: 112 U/L — ABNORMAL HIGH (ref 9–46)
AST: 44 U/L — AB (ref 10–40)
Albumin: 4.9 g/dL (ref 3.6–5.1)
BUN: 13 mg/dL (ref 7–25)
CALCIUM: 9.8 mg/dL (ref 8.6–10.3)
CHLORIDE: 100 mmol/L (ref 98–110)
CO2: 28 mmol/L (ref 20–31)
Creat: 1.14 mg/dL (ref 0.60–1.35)
GLUCOSE: 79 mg/dL (ref 65–99)
POTASSIUM: 4.1 mmol/L (ref 3.5–5.3)
SODIUM: 139 mmol/L (ref 135–146)
Total Bilirubin: 0.6 mg/dL (ref 0.2–1.2)
Total Protein: 7.5 g/dL (ref 6.1–8.1)

## 2016-05-06 LAB — TSH: TSH: 2.28 mIU/L (ref 0.40–4.50)

## 2016-05-06 LAB — VITAMIN D 25 HYDROXY (VIT D DEFICIENCY, FRACTURES): VIT D 25 HYDROXY: 18 ng/mL — AB (ref 30–100)

## 2016-05-07 ENCOUNTER — Other Ambulatory Visit: Payer: Self-pay | Admitting: Family Medicine

## 2016-05-07 MED ORDER — VITAMIN D (ERGOCALCIFEROL) 1.25 MG (50000 UNIT) PO CAPS: 50000.0000 [IU] | ORAL_CAPSULE | ORAL | Status: AC

## 2016-05-08 ENCOUNTER — Other Ambulatory Visit: Payer: Self-pay

## 2016-05-08 DIAGNOSIS — R112 Nausea with vomiting, unspecified: Secondary | ICD-10-CM

## 2016-05-08 DIAGNOSIS — R1013 Epigastric pain: Secondary | ICD-10-CM

## 2016-05-10 ENCOUNTER — Ambulatory Visit: Payer: Managed Care, Other (non HMO)

## 2016-05-16 ENCOUNTER — Encounter: Payer: Self-pay | Admitting: Family Medicine

## 2016-05-16 ENCOUNTER — Ambulatory Visit (INDEPENDENT_AMBULATORY_CARE_PROVIDER_SITE_OTHER): Payer: 59 | Admitting: Family Medicine

## 2016-05-16 ENCOUNTER — Ambulatory Visit
Admission: RE | Admit: 2016-05-16 | Discharge: 2016-05-16 | Disposition: A | Discharge status: Home / Self Care | Payer: Managed Care, Other (non HMO) | Source: Ambulatory Visit | Attending: Family Medicine | Admitting: Family Medicine

## 2016-05-16 VITALS — BP 136/82 | HR 80 | Temp 98.5°F | Resp 18 | Ht 72.0 in | Wt 202.2 lb

## 2016-05-16 DIAGNOSIS — K7581 Nonalcoholic steatohepatitis (NASH): Secondary | ICD-10-CM

## 2016-05-16 DIAGNOSIS — R1011 Right upper quadrant pain: Secondary | ICD-10-CM | POA: Diagnosis not present

## 2016-05-16 DIAGNOSIS — K760 Fatty (change of) liver, not elsewhere classified: Secondary | ICD-10-CM | POA: Insufficient documentation

## 2016-05-16 DIAGNOSIS — R112 Nausea with vomiting, unspecified: Secondary | ICD-10-CM | POA: Diagnosis not present

## 2016-05-16 DIAGNOSIS — R109 Unspecified abdominal pain: Secondary | ICD-10-CM

## 2016-05-16 MED ORDER — PROMETHAZINE HCL 12.5 MG PO TABS: 12.5000 mg | ORAL_TABLET | Freq: Three times a day (TID) | ORAL | Status: AC | PRN

## 2016-05-16 NOTE — Progress Notes (Signed)
Name: Carlos Allen   MRN: [id number]    DOB: Aug 26, 1996   Date:05/16/2016       Progress Note  Subjective  Chief Complaint  Chief Complaint  Patient presents with  . Emesis    Patient states he had the same symptoms Thursday April 11, 2016 and then it went away until Sunday the 16th he started feeling nausea again and vomiting-feels weak. Sharp stabbing pain in right lower back and lasted for a hour and half on Thursday at work and then happened again on Sunday night, has been taking Advil for pain relief.  Patient states the Omperazole has helped a little with not making him feel as nausea in the morning and has 2 left.     HPI  Nausea/vomting and abdominal pain: he states that about one month  ago he had fever, vomiting and diarrhea that lasted 4 days. He states it was not associated with abdominal pain. He did not seek medical help. He took some Tums.  He states he woke up at 2 am of July 5th vomiting. He states he feels nausea and is worse after meals and when he wakes up in am. Vomiting is usually only bile. He states initially pain was  epigastric and was  Radiating  to both upper quadrants, described as burning and sometimes a gnawing pain. No change in bowel movements. He came in last week, repeated US that still showed increase in echogenicity of the liver but no gallstones, labs normal, h. Pylori was not done, we will try to check it today. He continues to have nausea and vomiting, but not as intense, still taking promethazine, abdominal pain is now more localized on RUQ.    Patient Active Problem List   Diagnosis Date Noted  . 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 History of allergic contact dermatitis 05/18/2015  . NASH (nonalcoholic steatohepatitis) 04/29/2015  . Vitamin D deficiency 04/29/2015  . History of hypothyroidism 01/12/2010    Past Surgical History  Procedure Laterality Date  . Ganglion cyst excision    .  Circumcision      Family History  Problem Relation Age of Onset  . Hypothyroidism Mother   . Amblyopia Brother     Social History   Social History  . Marital Status: Single    Spouse Name: N/A  . Number of Children: 0  . Years of Education: N/A   Occupational History  . Student and [place] History Main Topics  . Smoking status: Never Smoker   . Smokeless tobacco: Never Used  . Alcohol Use: 0.6 oz/week    1 Standard drinks or equivalent per week     Comment: occasionally  . Drug Use: No  . Sexual Activity:    Partners: Female    Museum/gallery curator: None   Other Topics Concern  . Not on file   Social History Narrative   Graduated from [employer] spring 2017, working at a [employer] and will take some classes before he can apply to [employer].     Current outpatient prescriptions:  .  calcium carbonate (TUMS EX) 750 MG chewable tablet, Chew 1 tablet by mouth daily., Disp: , Rfl:  .  omeprazole (PRILOSEC) 40 MG capsule, Take 1 capsule (40 mg total) by mouth daily., Disp: 30 capsule, Rfl: 0 .  promethazine (PHENERGAN) 12.5 MG tablet, Take 1 tablet (12.5 mg total) by mouth every 8 (eight) hours as  needed for nausea or vomiting., Disp: 40 tablet, Rfl: 0 .  Vitamin D, Ergocalciferol, (DRISDOL) 50000 units CAPS capsule, Take 1 capsule (50,000 Units total) by mouth every 7 (seven) days., Disp: 12 capsule, Rfl: 0  No Known Allergies   ROS  Ten systems reviewed and is negative except as mentioned in HPI   Objective  Filed Vitals:   05/16/16 1522 05/16/16 1530  BP: 136/82   Pulse: 120 80  Temp: 98.5 F (36.9 C)   TempSrc: Oral   Resp: 18   Height: 6' (1.829 m)   Weight: 202 lb 3.2 oz (91.717 kg)   SpO2: 97%     Body mass index is 27.42 kg/(m^2).  Physical Exam  Constitutional: Patient appears well-developed and well-nourished. No distress.  HEENT: head atraumatic, normocephalic, pupils equal and reactive to light, neck  supple, throat within normal limits Cardiovascular: Normal rate, regular rhythm and normal heart sounds. No murmur heard. No BLE edema. Pulmonary/Chest: Effort normal and breath sounds normal. No respiratory distress. Abdominal: Soft. There is Tenderness RUQ  Psychiatric: Patient has a normal mood and affect. behavior is normal. Judgment and thought content normal.  Recent Results (from the past 2160 hour(s))  CBC with Differential/Platelet     Status: None   Collection Time: 05/05/16  4:56 PM  Result Value Ref Range   WBC 9.5 3.8 - 10.8 K/uL   RBC 5.40 4.20 - 5.80 MIL/uL   Hemoglobin 16.4 13.2 - 17.1 g/dL   HCT 47.4 38.5 - 50.0 %   MCV 87.8 80.0 - 100.0 fL   MCH 30.4 27.0 - 33.0 pg   MCHC 34.6 32.0 - 36.0 g/dL   RDW 13.3 11.0 - 15.0 %   Platelets 299 140 - 400 K/uL   MPV 11.1 7.5 - 12.5 fL   Neutro Abs 6365 1500 - 7800 cells/uL   Lymphs Abs 1995 850 - 3900 cells/uL   Monocytes Absolute 950 200 - 950 cells/uL   Eosinophils Absolute 95 15 - 500 cells/uL   Basophils Absolute 95 0 - 200 cells/uL   Neutrophils Relative % 67 %   Lymphocytes Relative 21 %   Monocytes Relative 10 %   Eosinophils Relative 1 %   Basophils Relative 1 %   Smear Review Criteria for review not met     Comment: ** Please note change in unit of measure and reference range(s). **  Comprehensive metabolic panel     Status: Abnormal   Collection Time: 05/05/16  4:56 PM  Result Value Ref Range   Sodium 139 135 - 146 mmol/L   Potassium 4.1 3.5 - 5.3 mmol/L   Chloride 100 98 - 110 mmol/L   CO2 28 20 - 31 mmol/L   Glucose, Bld 79 65 - 99 mg/dL   BUN 13 7 - 25 mg/dL   Creat 1.14 0.60 - 1.35 mg/dL   Total Bilirubin 0.6 0.2 - 1.2 mg/dL   Alkaline Phosphatase 81 40 - 115 U/L   AST 44 (H) 10 - 40 U/L   ALT 112 (H) 9 - 46 U/L   Total Protein 7.5 6.1 - 8.1 g/dL   Albumin 4.9 3.6 - 5.1 g/dL   Calcium 9.8 8.6 - 10.3 mg/dL  Lipase     Status: None   Collection Time: 05/05/16  4:56 PM  Result Value Ref Range    Lipase 16 7 - 60 U/L  TSH     Status: None   Collection Time: 05/05/16  4:56 PM  Result Value  Ref Range   TSH 2.28 0.40 - 4.50 mIU/L  VITAMIN D 25 Hydroxy (Vit-D Deficiency, Fractures)     Status: Abnormal   Collection Time: 05/05/16  4:56 PM  Result Value Ref Range   Vit D, 25-Hydroxy 18 (L) 30 - 100 ng/mL    Comment: Vitamin D Status           25-OH Vitamin D        Deficiency                <20 ng/mL        Insufficiency         20 - 29 ng/mL        Optimal             > or = 30 ng/mL   For 25-OH Vitamin D testing on patients on D2-supplementation and patients for whom quantitation of D2 and D3 fractions is required, the Quest[facility]ureD 25-OH VIT D, (D2,D3), LC/MS/MS is recommended: order code [id number] (patients > 2 yrs).      PHQ2/9: Depression screen [facility] 2/9 05/05/2016 10/07/2015 08/05/2015 04/29/2015  Decreased Interest 0 0 0 0  Down, Depressed, Hopeless 0 0 0 0  PHQ - 2 Score 0 0 0 0     Fall Risk: Fall Risk  05/05/2016 10/07/2015 08/05/2015 04/29/2015  Falls in the past year? No No No No    Assessment & Plan  1. Nausea and vomiting, vomiting of unspecified type  - promethazine (PHENERGAN) 12.5 MG tablet; Take 1 tablet (12.5 mg total) by mouth every 8 (eight) hours as needed for nausea or vomiting.  Dispense: 40 tablet; Refill: 0  2. NASH (nonalcoholic steatohepatitis)  He has a follow up with GI next week   3. RUQ abdominal pain  Two episodes over the past week, resolved now. Not associated with meals. First episode lasted 1.5 hours, radiated to his back.

## 2016-05-18 ENCOUNTER — Other Ambulatory Visit: Payer: Self-pay | Admitting: Family Medicine

## 2016-05-18 DIAGNOSIS — R1013 Epigastric pain: Secondary | ICD-10-CM

## 2016-05-18 DIAGNOSIS — R109 Unspecified abdominal pain: Secondary | ICD-10-CM

## 2016-05-18 LAB — H. PYLORI BREATH TEST: H. PYLORI UBIT: NEGATIVE

## 2016-05-18 MED ORDER — OMEPRAZOLE 40 MG PO CPDR: 40.0000 mg | DELAYED_RELEASE_CAPSULE | Freq: Every day | ORAL | Status: AC

## 2016-05-18 NOTE — Telephone Encounter (Signed)
Pt is needing a refill on omprazole to be sent to the [facility] on [address] In [place]

## 2016-05-23 ENCOUNTER — Ambulatory Visit: Payer: 59 | Admitting: Family Medicine

## 2016-06-05 LAB — HM COLONOSCOPY: HM COLON: NORMAL

## 2016-08-23 ENCOUNTER — Ambulatory Visit: Payer: 59 | Admitting: Family Medicine

## 2017-06-21 IMAGING — US US ABDOMEN LIMITED
1 series · 14 of 25 positions shown · non-contrast
Comparison: Ultrasound 01/15/2015.

CLINICAL DATA: Abdominal pain .

EXAM:
US ABDOMEN LIMITED - RIGHT UPPER QUADRANT

[Series 1: us abdomen limited · 0.23mm/px · 14 of 54 slices shown]
[im 1/54]
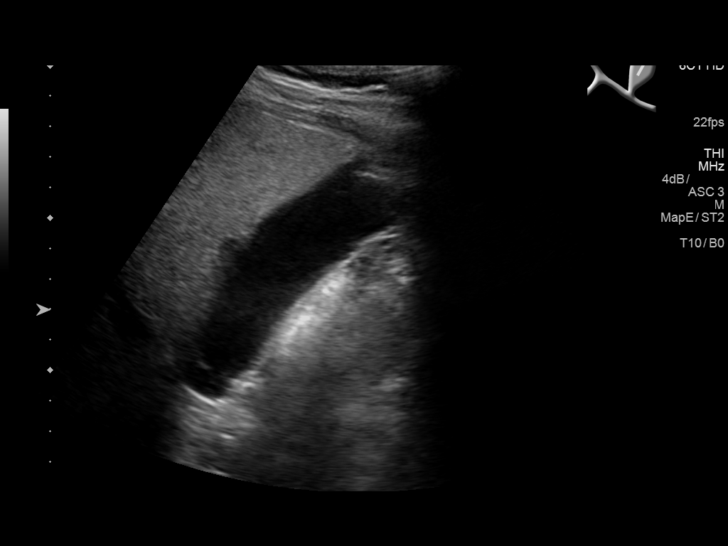
[im 5/54]
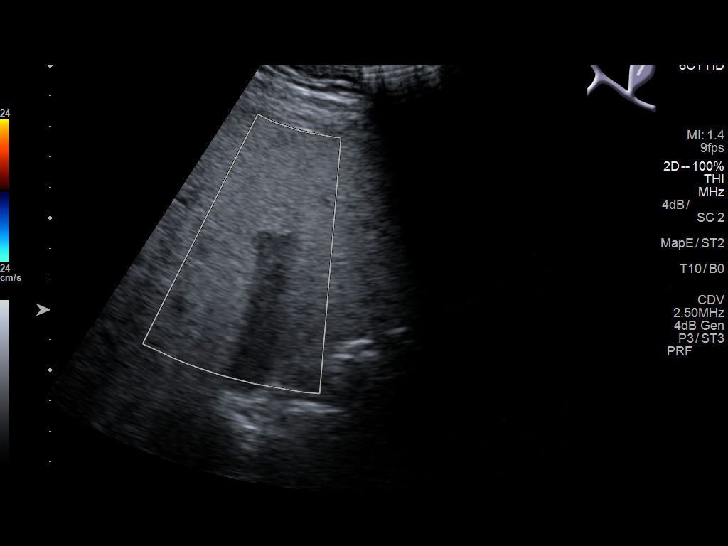
[im 9/54]
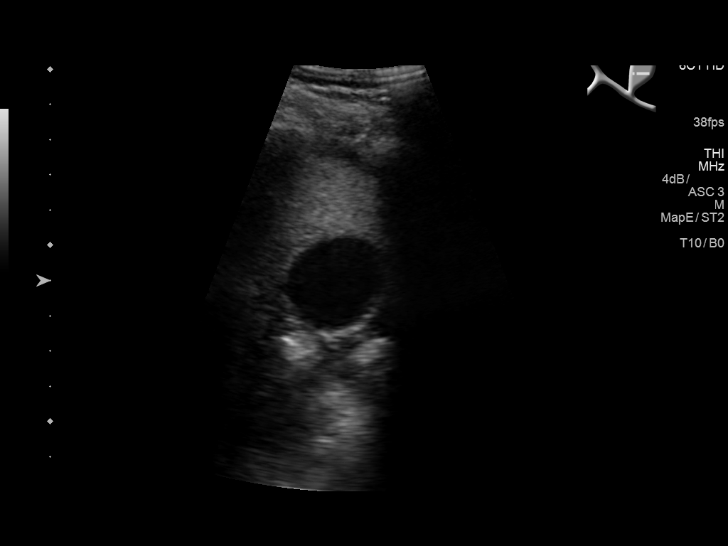
[im 14/54]
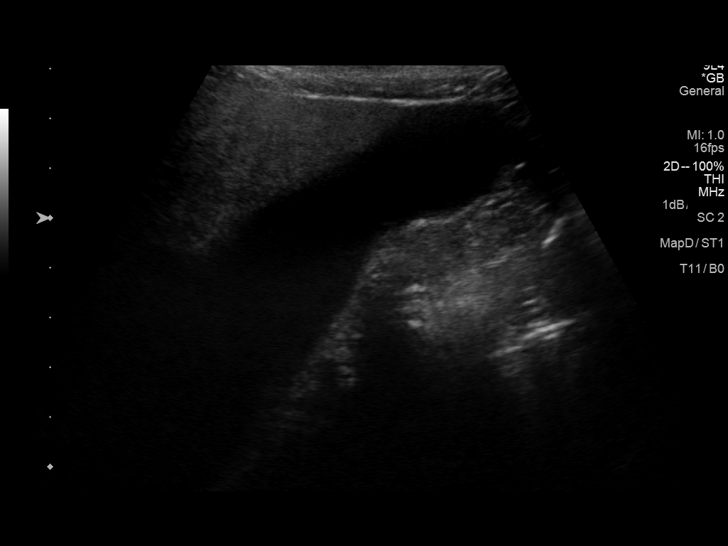
[im 18/54]
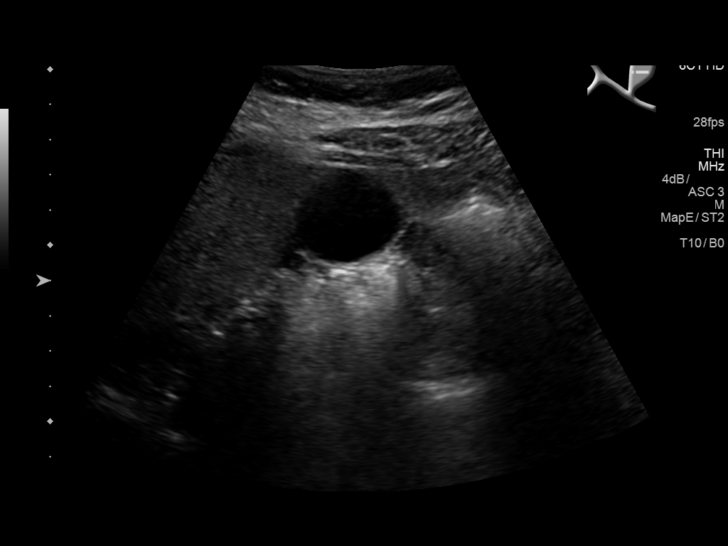
[im 20/54]
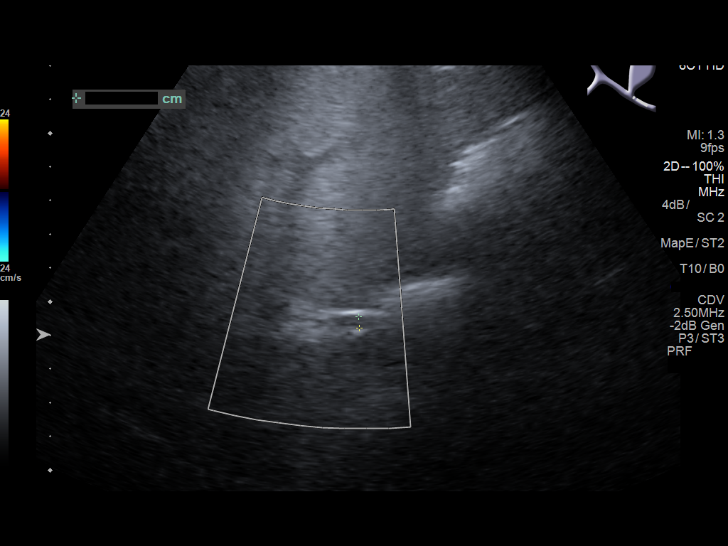
[im 25/54]
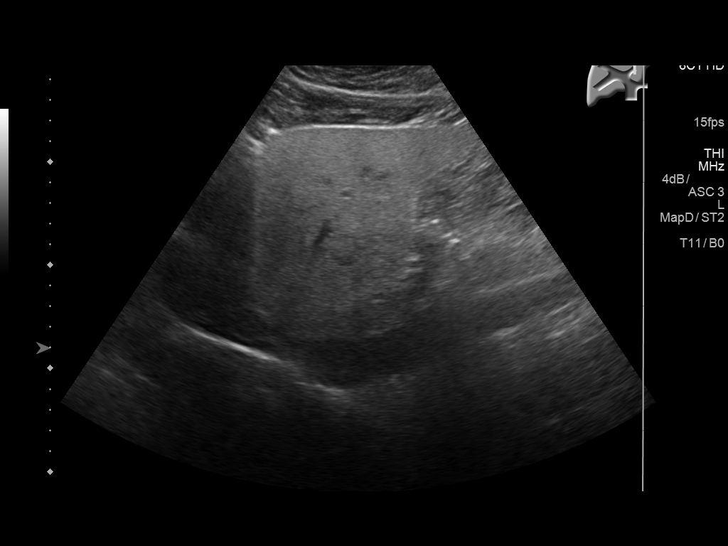
[im 29/54]
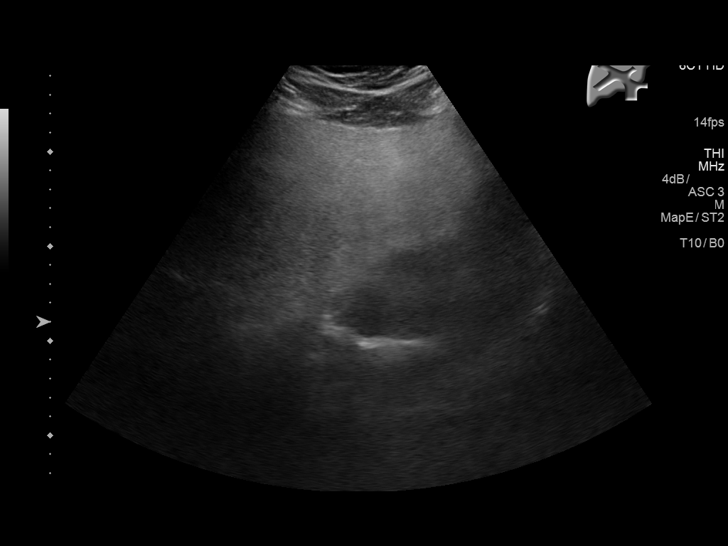
[im 34/54]
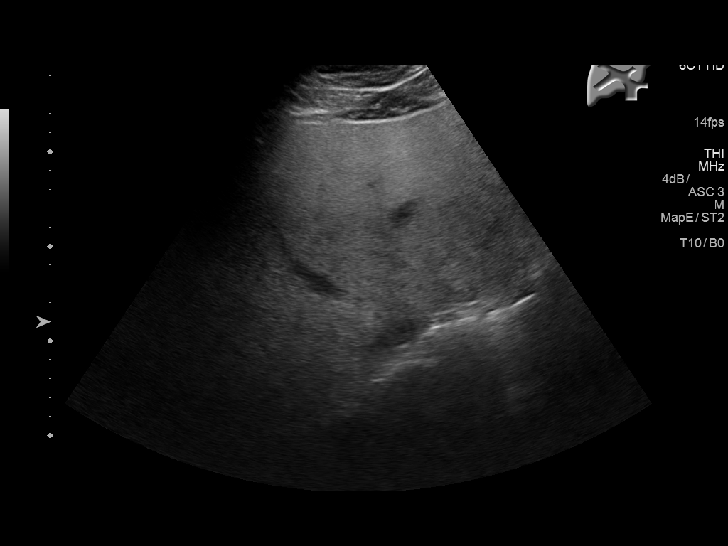
[im 36/54]
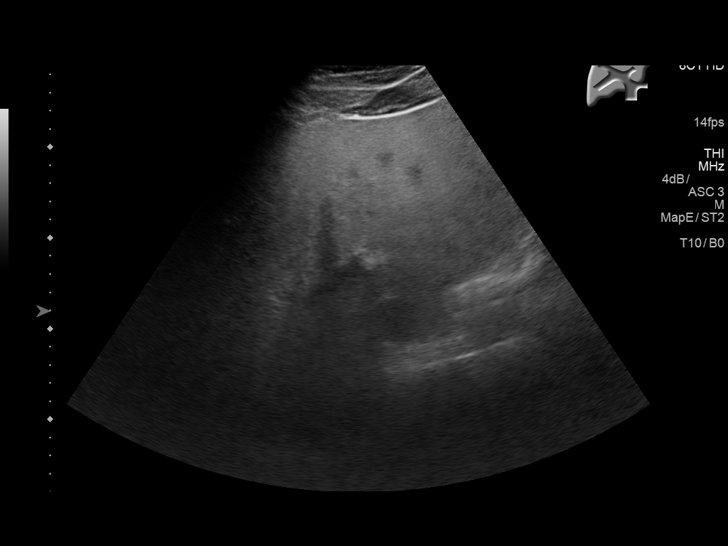
[im 40/54]
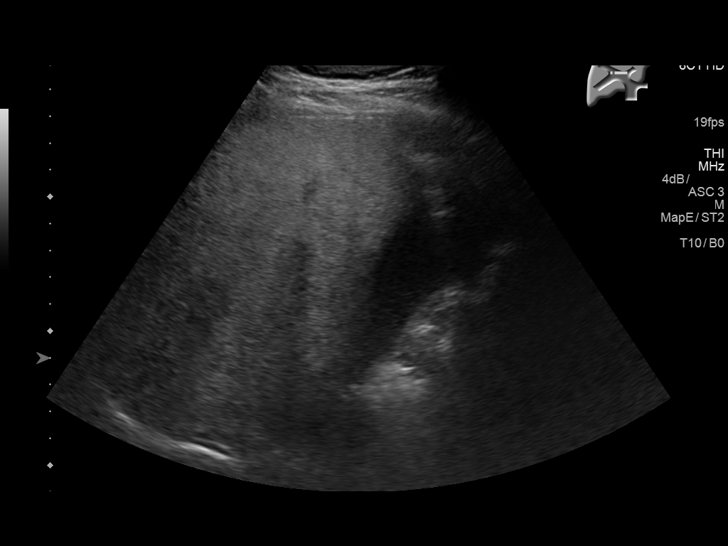
[im 45/54]
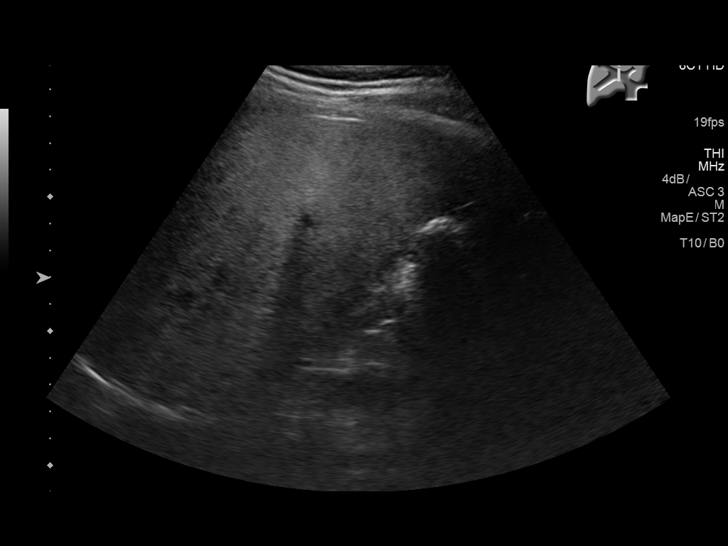
[im 49/54]
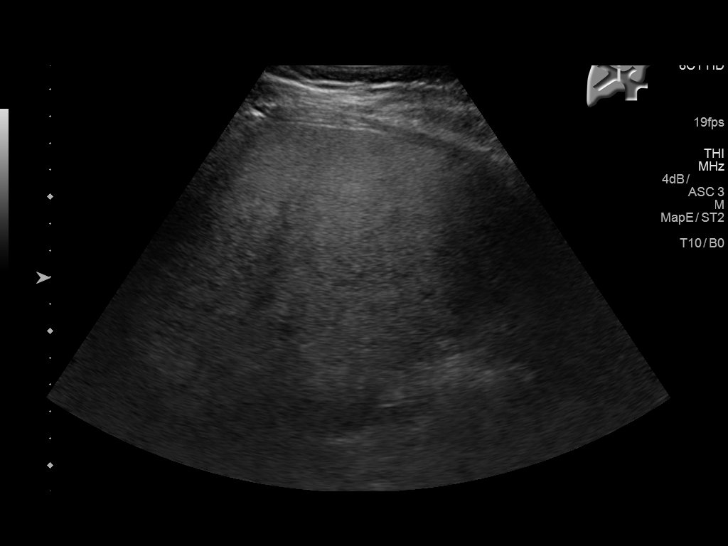
[im 54/54]
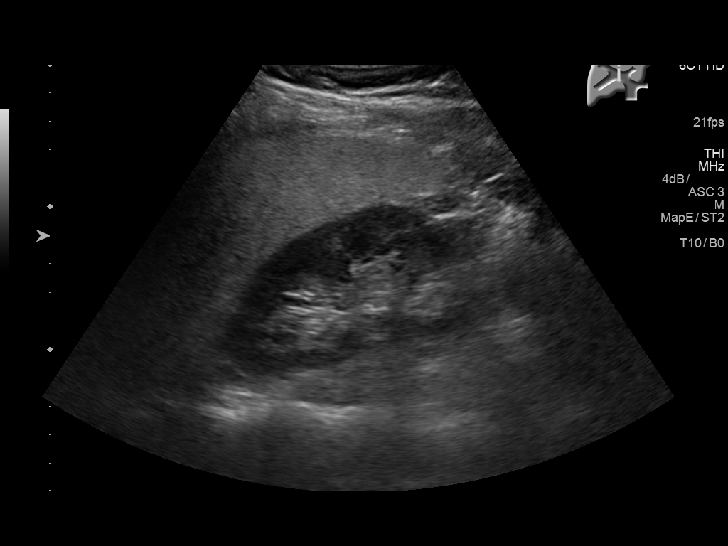

[14 of 25 positions shown; findings below may reference images not displayed]

FINDINGS: Gallbladder:

No gallstones or wall thickening visualized. No sonographic Murphy
sign noted by sonographer.

Common bile duct:

Diameter: 3.4 mm

Liver:

Increased echogenicity of the liver consistent fatty infiltration
and/or hepatocellular disease. Area focal fatty sparing noted
adjacent to the gallbladder fossa.
IMPRESSION: 1. Echogenic liver consistent fatty infiltration and/or
hepatocellular disease. Area focal fatty sparing noted adjacent to
the gallbladder fossa. Similar finding noted on prior exam. 2. Exam
otherwise unremarkable .

## 2022-08-06 ENCOUNTER — Emergency Department: Payer: No Typology Code available for payment source

## 2022-08-06 ENCOUNTER — Emergency Department
Admission: EM | Admit: 2022-08-06 | Discharge: 2022-08-07 | Disposition: A | Discharge status: Home / Self Care | Payer: No Typology Code available for payment source | Attending: Emergency Medicine | Admitting: Emergency Medicine

## 2022-08-06 ENCOUNTER — Other Ambulatory Visit: Payer: Self-pay

## 2022-08-06 DIAGNOSIS — W540XXA Bitten by dog, initial encounter: Secondary | ICD-10-CM | POA: Insufficient documentation

## 2022-08-06 DIAGNOSIS — S91351A Open bite, right foot, initial encounter: Secondary | ICD-10-CM | POA: Insufficient documentation

## 2022-08-06 MED ORDER — AMOXICILLIN-POT CLAVULANATE 875-125 MG PO TABS
1.0000 | ORAL_TABLET | Freq: Once | ORAL | Status: AC
  Administered 2022-08-06: 1 via ORAL
  Filled 2022-08-06: qty 1

## 2022-08-06 MED ORDER — AMOXICILLIN-POT CLAVULANATE 875-125 MG PO TABS: 1.0000 | ORAL_TABLET | Freq: Two times a day (BID) | ORAL | 0 refills | Status: AC

## 2022-08-06 NOTE — ED Provider Notes (Signed)
[facility] Provider Note    Event Date/Time   First MD Initiated Contact with Patient 08/06/22 2240     (approximate)   History   Animal Bite   HPI  Carlos Allen is a 26 y.o. male who presents to the emergency department today because of concerns for dog bite to his right foot.  He states that he was trying to break up a fight between some of his parents dogs.  One of the dogs then bit him on his right foot.  He does have some pain to the top of his foot.  He denies any other injuries.  States that the dogs are all up to date on the rabies vaccine.     Physical Exam   Triage Vital Signs: ED Triage Vitals  Enc Vitals Group     BP 08/06/22 1958 125/77     Pulse Rate 08/06/22 1958 74     Resp 08/06/22 1958 20     Temp 08/06/22 1958 97.9 F (36.6 C)     Temp Source 08/06/22 1958 Oral     SpO2 08/06/22 1958 96 %     Weight 08/06/22 1958 220 lb (99.8 kg)     Height 08/06/22 1958 6' (1.829 m)     Head Circumference --      Peak Flow --      Pain Score 08/06/22 2006 2     Pain Loc --      Pain Edu? --      Excl. in [place]? --     Most recent vital signs: Vitals:   08/06/22 1958  BP: 125/77  Pulse: 74  Resp: 20  Temp: 97.9 F (36.6 C)  SpO2: 96%   General: Awake, alert, oriented. CV:  Good peripheral perfusion.  Resp:  Normal effort.  Abd:  No distention.  Other:  Some swelling to dorsum of the right foot. Puncture type wound. NV intact distally.   ED Results / Procedures / Treatments   Labs (all labs ordered are listed, but only abnormal results are displayed) Labs Reviewed - No data to display   EKG  None   RADIOLOGY I independently interpreted and visualized the right foot. My interpretation: No acute fracture.  Radiology interpretation:  IMPRESSION:  Soft tissue swelling and soft tissue gas over the dorsum of the  right foot. No radiopaque foreign bodies. No acute bony  abnormalities.     PROCEDURES:  Critical Care performed: No  Procedures   MEDICATIONS ORDERED IN ED: Medications - No data to display   IMPRESSION / MDM / [place] / ED COURSE  I reviewed the triage vital signs and the nursing notes.                              Differential diagnosis includes, but is not limited to, FB, broken bone.  Patient's presentation is most consistent with acute presentation with potential threat to life or bodily function.  Patient presented to the emergency department today because of concerns for dog bite to the his right foot.  X-ray did not show any foreign body or fracture.  Did discuss concern for infections with dog bites with the patient.  Will give first dose of antibiotics here in the emergency department.  Will discharge with further antibiotics. Nursing staff cleaned and dressed the wound.  FINAL CLINICAL IMPRESSION(S) / ED DIAGNOSES   Final diagnoses:  Dog  bite, initial encounter    Note:  This document was prepared using Dragon voice recognition software and may include unintentional dictation errors.    Nance Pear, MD 08/06/22 [phone]

## 2022-08-06 NOTE — Discharge Instructions (Signed)
Please seek medical attention for any high fevers, chest pain, shortness of breath, change in behavior, persistent vomiting, bloody stool or any other new or concerning symptoms.  

## 2022-08-06 NOTE — ED Triage Notes (Signed)
Pt arrives with c/o dog bite on right foot. Dog was pts parents dog and he is not worried about rabies.

## 2023-02-28 ENCOUNTER — Telehealth: Payer: Self-pay | Admitting: Internal Medicine

## 2023-02-28 NOTE — Telephone Encounter (Signed)
Copied from CRM [id number]. Topic: General - Inquiry >> Feb 28, 2023  3:22 PM Clide Dales wrote: Patient is requesting a copy of his vaccination records. Please advise.

## 2023-03-08 NOTE — Telephone Encounter (Signed)
Pt called  asking about his childhood vaccinations.  He said he was a patient of Dr. Carlynn Purl.  Pt wants the records for himself   [phone]

## 2023-03-09 NOTE — Telephone Encounter (Signed)
Called and lvm informing patient copy at front desk

## 2023-03-09 NOTE — Telephone Encounter (Signed)
Pt is calling to report his mother Christena Deem will pick up vaccination record.
